# Patient Record
Sex: Male | Born: 1945 | Race: White | Hispanic: No | Marital: Married | Smoking: Former smoker
Health system: Southern US, Community
[De-identification: ages and names within clinical notes are randomized; demographics above are authoritative.]

## PROBLEM LIST (undated history)

## (undated) DIAGNOSIS — E785 Hyperlipidemia, unspecified: Secondary | ICD-10-CM

## (undated) DIAGNOSIS — H269 Unspecified cataract: Secondary | ICD-10-CM

## (undated) DIAGNOSIS — G43909 Migraine, unspecified, not intractable, without status migrainosus: Secondary | ICD-10-CM

## (undated) DIAGNOSIS — N200 Calculus of kidney: Secondary | ICD-10-CM

## (undated) DIAGNOSIS — N419 Inflammatory disease of prostate, unspecified: Secondary | ICD-10-CM

## (undated) HISTORY — DX: Hyperlipidemia, unspecified: E78.5

## (undated) HISTORY — DX: Migraine, unspecified, not intractable, without status migrainosus: G43.909

## (undated) HISTORY — PX: POLYPECTOMY: SHX149

## (undated) HISTORY — PX: PROSTATE BIOPSY: SHX241

## (undated) HISTORY — DX: Inflammatory disease of prostate, unspecified: N41.9

## (undated) HISTORY — PX: TONSILLECTOMY: SUR1361

## (undated) HISTORY — DX: Calculus of kidney: N20.0

## (undated) HISTORY — PX: OTHER SURGICAL HISTORY: SHX169

## (undated) HISTORY — DX: Unspecified cataract: H26.9

---

## 2011-10-09 HISTORY — PX: COLONOSCOPY: SHX174

## 2012-03-24 LAB — LIPID PANEL
LDL Cholesterol: 165 mg/dL
Triglycerides: 154 mg/dL (ref 40–160)

## 2012-03-24 LAB — CBC AND DIFFERENTIAL
Hemoglobin: 14.2 g/dL (ref 13.5–17.5)
WBC: 6.4 10^3/mL

## 2012-03-24 LAB — HEMOGLOBIN A1C: Hgb A1c MFr Bld: 6.1 % — AB (ref 4.0–6.0)

## 2012-03-24 LAB — HEPATIC FUNCTION PANEL
ALT: 17 U/L (ref 10–40)
AST: 28 U/L (ref 14–40)

## 2012-03-24 LAB — BASIC METABOLIC PANEL
Creatinine: 1.1 mg/dL (ref 0.6–1.3)
Glucose: 103 mg/dL
Potassium: 4.1 mmol/L (ref 3.4–5.3)

## 2012-03-24 LAB — TSH: TSH: 1.78 u[IU]/mL (ref 0.41–5.90)

## 2013-07-28 ENCOUNTER — Ambulatory Visit (INDEPENDENT_AMBULATORY_CARE_PROVIDER_SITE_OTHER): Payer: BC Managed Care – PPO | Admitting: Family Medicine

## 2013-07-28 ENCOUNTER — Encounter: Payer: Self-pay | Admitting: Family Medicine

## 2013-07-28 VITALS — BP 154/93 | HR 64 | Ht 76.0 in | Wt 266.0 lb

## 2013-07-28 DIAGNOSIS — L738 Other specified follicular disorders: Secondary | ICD-10-CM

## 2013-07-28 DIAGNOSIS — I10 Essential (primary) hypertension: Secondary | ICD-10-CM

## 2013-07-28 DIAGNOSIS — Z13220 Encounter for screening for lipoid disorders: Secondary | ICD-10-CM

## 2013-07-28 DIAGNOSIS — N411 Chronic prostatitis: Secondary | ICD-10-CM | POA: Insufficient documentation

## 2013-07-28 DIAGNOSIS — N200 Calculus of kidney: Secondary | ICD-10-CM

## 2013-07-28 DIAGNOSIS — L739 Follicular disorder, unspecified: Secondary | ICD-10-CM | POA: Insufficient documentation

## 2013-07-28 DIAGNOSIS — Z860100 Personal history of colon polyps, unspecified: Secondary | ICD-10-CM | POA: Insufficient documentation

## 2013-07-28 DIAGNOSIS — E785 Hyperlipidemia, unspecified: Secondary | ICD-10-CM

## 2013-07-28 DIAGNOSIS — R7309 Other abnormal glucose: Secondary | ICD-10-CM

## 2013-07-28 DIAGNOSIS — R7303 Prediabetes: Secondary | ICD-10-CM

## 2013-07-28 LAB — BASIC METABOLIC PANEL WITH GFR
BUN: 15 mg/dL (ref 6–23)
Chloride: 100 mEq/L (ref 96–112)
GFR, Est African American: >89 mL/min
GFR, Est Non African American: 86 mL/min
Glucose, Bld: 99 mg/dL (ref 70–99)
Potassium: 4.4 mEq/L (ref 3.5–5.3)
Sodium: 139 mEq/L (ref 135–145)

## 2013-07-28 LAB — LIPID PANEL
Cholesterol: 215 mg/dL — ABNORMAL HIGH (ref 0–200)
VLDL: 51 mg/dL — ABNORMAL HIGH (ref 0–40)

## 2013-07-28 MED ORDER — NYSTATIN-TRIAMCINOLONE 100000-0.1 UNIT/GM-% EX CREA: TOPICAL_CREAM | Freq: Three times a day (TID) | CUTANEOUS | Status: DC

## 2013-07-28 NOTE — Progress Notes (Signed)
CC: Anthony Santana is a 67 y.o. male is here for Establish Care   Subjective: HPI:  67 year old here to establish care who brings in medical records for review  Patient reports a history of essential hypertension reluctant to take antihypertensive medication. He tries to walk on a daily basis institute a low-salt diet. Most recent creatinine 1.07 2013. TSH 1.70 2013. In April 2013 he had a myocardial perfusion scan showing normal left ventricular size, no perfusion defect, normal ejection fraction estimated 55%.  Reports a history of abnormally elevated PSAs. He has had a total of 3 different biopsy procedures all of which have been negative for any oncologic process. His most recent biopsy caused him to become septic 2 days after biopsy he reports spending almost a week in the ICU unit in [place]. He denies any recent or remote history of dysuria, blood in urine, urinary hesitancy, awakening at night to urinate, weak stream, nor sensation of urinary retention. He has been trying to take a natural approach to treatment with saw palmetto  Labs and patient reported a history of hyperlipidemia he reports a statin intolerance. Most recent labs total cholesterol 236, triglycerides 154, HDL, 40, LDL 165. He's been using omega-3 supplements along with niacin however he is unsure what his niacin doses.  On review of his recent blood work hemoglobin A1c 6.08 March 2012 he is unaware of any remote diagnosis of prediabetes or diabetes.  He believes that he has had 2 or 3 colonoscopies the first were without any abnormality. He had one performed on September 2013 showing 3 polyps initial recommendation was to followup in 1 year pathology report showing tubular adenoma x2 without dysplasia or malignancy, rectal polyp with fragments of only hyperplastic polyp. Denies any recent or remote rectal bleeding.  Buttock Pain patient complains of buttock pain on the left buttock that has come and gone for the past  years. Described as a soreness worse with sitting nonradiating improves with topical steroid and nystatin cream. He has run out of this medication. He reports that he has had multiple boils lanced in the buttock region in the remote past. Currently denies fevers, chills, nor drainage at the site of pain.  Review of Systems - General ROS: negative for - chills, fever, night sweats, weight gain or weight loss Ophthalmic ROS: negative for - decreased vision Psychological ROS: negative for - anxiety or depression ENT ROS: negative for - hearing change, nasal congestion, tinnitus or allergies Hematological and Lymphatic ROS: negative for - bleeding problems, bruising or swollen lymph nodes Breast ROS: negative Respiratory ROS: no cough, shortness of breath, or wheezing Cardiovascular ROS: no chest pain or dyspnea on exertion Gastrointestinal ROS: no abdominal pain, change in bowel habits, or black or bloody stools Genito-Urinary ROS: negative for - genital discharge, genital ulcers, incontinence or abnormal bleeding from genitals Musculoskeletal ROS: negative for - joint pain or muscle pain Neurological ROS: negative for - headaches or memory loss Dermatological ROS: negative for mole changes, rash and skin lesion changes other than that described on buttocks  Past Medical History  Diagnosis Date  . Hyperlipidemia      Family History  Problem Relation Age of Onset  . Alcohol abuse    . Hyperlipidemia    . Hypertension    . Cancer       History  Substance Use Topics  . Smoking status: Former Smoker    Quit date: 07/28/1981  . Smokeless tobacco: Not on file  . Alcohol Use: No  Objective: Filed Vitals:   07/28/13 1057  BP: 154/93  Pulse: 64    General: Alert and Oriented, No Acute Distress HEENT: Pupils equal, round, reactive to light. Conjunctivae clear.  External ears unremarkable, canals clear with intact TMs with appropriate landmarks.  Middle ear appears open without  effusion. Pink inferior turbinates.  Moist mucous membranes, pharynx without inflammation nor lesions.  Neck supple without palpable lymphadenopathy nor abnormal masses. Lungs: Clear to auscultation bilaterally, no wheezing/ronchi/rales.  Comfortable work of breathing. Good air movement. Cardiac: Regular rate and rhythm. Normal S1/S2.  No murmurs, rubs, nor gallops.   Extremities: No peripheral edema.  Strong peripheral pulses.  Mental Status: No depression, anxiety, nor agitation. Skin: Warm and dry. On the left buttock there is a 4 cm diameter patch of folliculitis  Assessment & Plan: Reedy was seen today for establish care.  Diagnoses and associated orders for this visit:  Essential hypertension, benign - BASIC METABOLIC PANEL WITH GFR  Chronic prostatitis - PSA  Hyperlipidemia  Prediabetes  Folliculitis - Discontinue: nystatin-triamcinolone (MYCOLOG II) cream; Apply topically 3 (three) times daily. - nystatin-triamcinolone (MYCOLOG II) cream; Apply topically 3 (three) times daily.  Lipid screening - Lipid panel  Nephrolithiasis  Personal history of colonic polyps    Essential hypertension: Uncontrolled, Encouraged patient to start antihypertensive therapy declined by patient Chronic prostatitis: Checking PSA, joint decision to refer to urology only if significantly elevating Hyperlipidemia: Uncontrolled, patient does not want to start statin he will consider restarting niacin and continuing fish oil if liver panel today is again uncontrolled Prediabetes: Checking fasting blood sugar today Folliculitis: Discussed that he may need to start antibiotic her he would only like to continue with triamcinolone nystatin which has worked in the past. Signs and symptoms requring emergent/urgent reevaluation were discussed with the patient. Personal history of colonic polyps:  Brought to his attention that he is due for repeat colonoscopy based on report from September 2013, he wants to  wait until September 2015  45 minutes spent face-to-face during visit today of which at least 50% was counseling or coordinating care regarding nephrolithiasis, chronic prostatitis, essential hypertension, hyperlipidemia, prediabetes, folliculitis, colonic polyps.   Return in about 3 months (around 10/28/2013).

## 2013-07-29 ENCOUNTER — Encounter: Payer: Self-pay | Admitting: Family Medicine

## 2013-07-29 LAB — PSA: PSA: 5.69 ng/mL — ABNORMAL HIGH (ref <=4.00)

## 2013-08-05 ENCOUNTER — Encounter: Payer: Self-pay | Admitting: Family Medicine

## 2013-08-14 ENCOUNTER — Encounter: Payer: Self-pay | Admitting: *Deleted

## 2013-10-28 ENCOUNTER — Encounter: Payer: Self-pay | Admitting: Family Medicine

## 2013-10-28 ENCOUNTER — Telehealth: Payer: Self-pay | Admitting: Family Medicine

## 2013-10-28 ENCOUNTER — Ambulatory Visit (INDEPENDENT_AMBULATORY_CARE_PROVIDER_SITE_OTHER): Payer: BC Managed Care – PPO | Admitting: Family Medicine

## 2013-10-28 VITALS — BP 165/93 | HR 67 | Wt 269.0 lb

## 2013-10-28 DIAGNOSIS — L259 Unspecified contact dermatitis, unspecified cause: Secondary | ICD-10-CM

## 2013-10-28 DIAGNOSIS — R972 Elevated prostate specific antigen [PSA]: Secondary | ICD-10-CM | POA: Insufficient documentation

## 2013-10-28 DIAGNOSIS — L739 Follicular disorder, unspecified: Secondary | ICD-10-CM

## 2013-10-28 DIAGNOSIS — L738 Other specified follicular disorders: Secondary | ICD-10-CM

## 2013-10-28 DIAGNOSIS — F4321 Adjustment disorder with depressed mood: Secondary | ICD-10-CM

## 2013-10-28 DIAGNOSIS — E785 Hyperlipidemia, unspecified: Secondary | ICD-10-CM

## 2013-10-28 DIAGNOSIS — I10 Essential (primary) hypertension: Secondary | ICD-10-CM

## 2013-10-28 DIAGNOSIS — Z86010 Personal history of colonic polyps: Secondary | ICD-10-CM

## 2013-10-28 DIAGNOSIS — L309 Dermatitis, unspecified: Secondary | ICD-10-CM

## 2013-10-28 MED ORDER — TRIAMCINOLONE ACETONIDE 0.1 % EX CREA: TOPICAL_CREAM | CUTANEOUS | Status: DC

## 2013-10-28 MED ORDER — NYSTATIN 100000 UNIT/GM EX CREA: 1.0000 "application " | TOPICAL_CREAM | Freq: Two times a day (BID) | CUTANEOUS | Status: DC | PRN

## 2013-10-28 NOTE — Telephone Encounter (Signed)
Patient was here today asking me for his medical records. He wanted to take them since he got a call to pick them up but I do not know what to tell the patient. I did not see them up front. Pt's (p) [phone]. Thanks

## 2013-10-28 NOTE — Progress Notes (Signed)
CC: Anthony Santana is a 68 y.o. male is here for Hyperlipidemia   Subjective: HPI:  Essential hypertension: No outside blood pressures to report. He denies chest pain, shortness of breath, orthopnea nor peripheral edema. He is still reluctant to take any medications for blood pressure however has joined a gym is going there 3 days a week spending more than an hour they are doing strength training and cardio.  Followup hyperlipidemia: Continues to take niacin and fish oil. He did not start red yeast rice and so saw him last. Denies right upper quadrant pain, limb claudication, nor exertional chest pain tries to watch what he eats and working out more  History colonic polyps: Based on his most recent colonoscopy in 2013 his former gastroenterologist encouraged him to have a repeat colonoscopy in 2014. He denies any melena or rectal bleeding. He would like to go through with colonoscopy referral. Denies unintentional weight loss or new aches or pains  History of elevated PSA this has always been somewhat covered above or below 10 for the past at least 3 years. He's had multiple biopsies one of which required ICU stay for sepsis. He denies any lower urinary tract symptoms but is requesting referral to urologist  Folliculitis/dermatitis: He has been using a self mixture of triamcinolone and nystatin for this condition on his buttocks. He states that symptoms are absent now after using 2 weeks of the medication. He occasionally gets flares during humid months or after driving for long periods of time. He denies any rashes or skin changes currently  He shares the sad news that his mother was recently transferred to a skilled nursing facility and no longer has any short-term memory whatsoever. This is taking a emotional toll on  Him and states that prior to this news he was feeling feeling amazing prior to the news he got this morning both physically and emotionally . He is tearful and doesn't know how to  handle the news.   Review Of Systems Outlined In HPI  Past Medical History  Diagnosis Date  . Hyperlipidemia      Family History  Problem Relation Age of Onset  . Alcohol abuse    . Hyperlipidemia    . Hypertension    . Cancer       History  Substance Use Topics  . Smoking status: Former Smoker    Quit date: 07/28/1981  . Smokeless tobacco: Not on file  . Alcohol Use: No     Objective: Filed Vitals:   10/28/13 1051  BP: 165/93  Pulse: 67    General: Alert and Oriented, No Acute Distress HEENT: Pupils equal, round, reactive to light. Conjunctivae clear.  Moist mucous membranes pharynx unremarkable Lungs: Clear to auscultation bilaterally, no wheezing/ronchi/rales.  Comfortable work of breathing. Good air movement. Cardiac: Regular rate and rhythm. Normal S1/S2.  No murmurs, rubs, nor gallops.   Abdomen: Obese and soft Extremities: No peripheral edema.  Strong peripheral pulses.  Mental Status: Mild depression and tearfulness however and no, anxiety, nor agitation. Skin: Warm and dry.  Assessment & Plan: Anthony Santana was seen today for hyperlipidemia.  Diagnoses and associated orders for this visit:  Essential hypertension, benign  Hyperlipidemia  Personal history of colonic polyps - Ambulatory referral to Gastroenterology  Elevated PSA - Ambulatory referral to Urology  Folliculitis  Dermatitis - nystatin cream (MYCOSTATIN); Apply 1 application topically 2 (two) times daily as needed for dry skin (irritation). - triamcinolone cream (KENALOG) 0.1 %; Apply to affected areas twice a  day for up to two weeks, avoid face.  Grief    Essential hypertension: Encouraged to start on antihypertensives he politely declines, urged to continue with exercise most days of the week and salt restriction Hyperlipidemia: Uncontrolled encouraged him to continue on fish oil and start red yeast rice History colonic polyps: He is agreeable to colonoscopy referral this is been  placed Elevated PSA: Per his request urology referral Dermatitis/folliculitis: Resolved continue as needed use of nystatin/triamcinolone discussed signs and symptoms of boil/abscess that would require office visit for lancing Grief: Discussed that there are a few medications that help preserve memory however no true medications to reverse short-term memory loss or dementia, discussed that he inquire into certain reversible causes that could be causing difficulty with his mother.  Brief counseling provided for this heavy news  Return in about 3 months (around 01/26/2014).

## 2013-10-28 NOTE — Telephone Encounter (Signed)
The records that the patient is referring to had been left up at the front about 3 months ago.Unfortunatley the records have be shredded. After speaking with Anthony Santana our clinical supervisor our protocol will be that if records are not picked up after 90 days then they will be discarded. This is the message that I left on this patients vm

## 2013-11-05 ENCOUNTER — Telehealth: Payer: Self-pay | Admitting: *Deleted

## 2013-11-05 NOTE — Telephone Encounter (Signed)
Pt called and states he has not heard anything about his referral to urology and GI. The referral along with office notes nad demo have been sent to both offices. i called and left a message on pt's vm that referrals have been sent and waiting on their office to contact him and he could call to check on the status.  I left the number of [facility] on his vm

## 2013-11-11 ENCOUNTER — Encounter: Payer: Self-pay | Admitting: Internal Medicine

## 2013-12-28 ENCOUNTER — Telehealth: Payer: Self-pay | Admitting: *Deleted

## 2013-12-28 ENCOUNTER — Ambulatory Visit (AMBULATORY_SURGERY_CENTER): Payer: Self-pay | Admitting: *Deleted

## 2013-12-28 VITALS — Ht 76.0 in | Wt 272.6 lb

## 2013-12-28 DIAGNOSIS — Z86010 Personal history of colonic polyps: Secondary | ICD-10-CM

## 2013-12-28 MED ORDER — MOVIPREP 100 G PO SOLR: ORAL | Status: DC

## 2013-12-28 NOTE — Telephone Encounter (Signed)
Patient is for direct colonoscopy on 01/11/14. His last colonoscopy was 06/13/2012 in [place], polyps removed. Colon report and path report is in EPIC. Patient also had previous colonoscopies in 2004 and 2009 which were "normal,no polyps" per patient, no records. Patient was verbally told by his GI doctor in [place] to repeat colonoscopy in 2 years. Patient denies any GI problems/concerns, and he denies any family HX colon cancer.  Please review. Recall colonoscopy due at this time? Please advise, thank you. Anthony Santana

## 2013-12-28 NOTE — Progress Notes (Signed)
Patient denies any allergies to eggs or soy. Patient denies any problems with anesthesia. Phone note sent to Dr.Pyrtle to review records and to see if recall colon due now. EF 55% per records patient has in hands. Patient denies ANY heart problems or lung problems.

## 2013-12-29 NOTE — Telephone Encounter (Signed)
The previous GI MD performing the last colonoscopy, recommended in his procedure note, to repeat the exam in 1 year.  The reasoning behind this is unclear. Normally surveillance interval, 2 adenomatous polyps, the largest being 11 mm, without HGD would be repeat colonoscopy in 3 years.  This would be 06/14/2015 Thus, it appears, unless there is another reason not ascertainable by reviewing the procedure note and path from 2013, he would be due repeat in Sept 2016 If he would like to discuss this further, he can see me in clinic. Thanks

## 2013-12-29 NOTE — Telephone Encounter (Signed)
Noted- tried to call pt and not able to leave message

## 2013-12-30 NOTE — Telephone Encounter (Signed)
Anthony Santana talked with pt.  Colonoscopy cancelled and recall for 06/2015 entered into EPIC

## 2013-12-30 NOTE — Telephone Encounter (Signed)
Attempted to call pt:  His phone will not receive phone calls from blocked phone.  Barbie Haggis will call pt

## 2014-01-11 ENCOUNTER — Encounter: Payer: BC Managed Care – PPO | Admitting: Internal Medicine

## 2014-01-26 ENCOUNTER — Encounter: Payer: Self-pay | Admitting: Family Medicine

## 2014-01-26 ENCOUNTER — Ambulatory Visit (INDEPENDENT_AMBULATORY_CARE_PROVIDER_SITE_OTHER): Payer: BC Managed Care – PPO | Admitting: Family Medicine

## 2014-01-26 VITALS — BP 155/89 | HR 66 | Temp 97.5°F | Wt 274.0 lb

## 2014-01-26 DIAGNOSIS — J329 Chronic sinusitis, unspecified: Secondary | ICD-10-CM

## 2014-01-26 DIAGNOSIS — E785 Hyperlipidemia, unspecified: Secondary | ICD-10-CM

## 2014-01-26 DIAGNOSIS — B9689 Other specified bacterial agents as the cause of diseases classified elsewhere: Secondary | ICD-10-CM

## 2014-01-26 DIAGNOSIS — L0390 Cellulitis, unspecified: Secondary | ICD-10-CM

## 2014-01-26 DIAGNOSIS — N529 Male erectile dysfunction, unspecified: Secondary | ICD-10-CM

## 2014-01-26 DIAGNOSIS — L0291 Cutaneous abscess, unspecified: Secondary | ICD-10-CM

## 2014-01-26 DIAGNOSIS — A499 Bacterial infection, unspecified: Secondary | ICD-10-CM

## 2014-01-26 LAB — LIPID PANEL
CHOLESTEROL: 193 mg/dL (ref 0–200)
HDL: 36 mg/dL — ABNORMAL LOW (ref >39)
LDL Cholesterol: 117 mg/dL — ABNORMAL HIGH (ref 0–99)
Total CHOL/HDL Ratio: 5.4 Ratio
Triglycerides: 202 mg/dL — ABNORMAL HIGH (ref <150)
VLDL: 40 mg/dL (ref 0–40)

## 2014-01-26 MED ORDER — DOXYCYCLINE HYCLATE 100 MG PO TABS: 100.0000 mg | ORAL_TABLET | Freq: Two times a day (BID) | ORAL | Status: DC

## 2014-01-26 MED ORDER — SILDENAFIL CITRATE 50 MG PO TABS: 25.0000 mg | ORAL_TABLET | Freq: Every day | ORAL | Status: DC | PRN

## 2014-01-26 NOTE — Progress Notes (Signed)
CC: Anthony Santana is a 68 y.o. male is here for f/u chol and Hypertension   Subjective: HPI:  Followup hyperlipidemia: Since I saw patient last he has been taking 3 tablets of niacin on a daily basis, it sounds like this is a total of 1500 mg. Has also been taking red yeast rice of the unknown dose. Has been working out 3 days a week in cutting back on saturated fats. Denies chest pain, and claudication, myalgias nor right upper quadrant pain. Denies flushing  Patient complains of sinus pressure that has been localized above and below the eyes present for about a week worsening on a daily basis. Mild during the day, moderate at night, worse when lying down flat. Has been interfering with sleep due to drainage and nasal congestion. Nothing particularly makes better or worse other than above. He has been taking an Alka-Seltzer product with decongestant. Denies fevers, chills, shortness of breath, productive cough, chest pain, nor motor or sensory disturbances  Patient complains of clear drainage from his left buttock that has been present for the past week. He is a chronic draining pore that completely resolved as of one month ago using niacin and triamcinolone cream on a regimen that has been helpful for this in the past. Without any particular event he began draining earlier this week. He has not started any interventions yet.  Describes discharge as mild, clear, without blood or discoloration. Denies blood or rectal pain.  Complains of difficulty initiating erection has been present for matter of months, moderate severity, occurs any day of the week anytime of the day when he and his wife tried to have sex. Denies any dysuria, nor any other genitourinary complaints.  He works out 70 minutes at a time doing cardio above moderate intensity without any recent or remote chest discomfort, limb claudication, diaphoresis, nor motor or sensory disturbances   Review Of Systems Outlined In HPI  Past  Medical History  Diagnosis Date  . Hyperlipidemia   . Prostatitis   . Cataract   . Kidney stones     Past Surgical History  Procedure Laterality Date  . Polyps removed from colon    . Prostate biopsy      x3; 1st time went into septic shock  . Colonoscopy    . Tonsillectomy     Family History  Problem Relation Age of Onset  . Testicular cancer Father   . Alcohol abuse Father   . Cancer Maternal Grandmother   . Diabetes Maternal Grandmother   . Colon cancer Neg Hx     History   Social History  . Marital Status: Married    Spouse Name: N/A    Number of Children: N/A  . Years of Education: N/A   Occupational History  . Not on file.   Social History Main Topics  . Smoking status: Former Smoker    Quit date: 07/28/1981  . Smokeless tobacco: Never Used  . Alcohol Use: No  . Drug Use: No  . Sexual Activity: Yes   Other Topics Concern  . Not on file   Social History Narrative  . No narrative on file     Objective: BP 155/89  Pulse 66  Temp(Src) 97.5 F (36.4 C) (Oral)  Wt 274 lb (124.286 kg)  General: Alert and Oriented, No Acute Distress HEENT: Pupils equal, round, reactive to light. Conjunctivae clear.  External ears unremarkable, canals clear with intact TMs with appropriate landmarks.  Middle ear appears open without effusion. Pink inferior  turbinates.  Moist mucous membranes, pharynx without inflammation nor lesions however moderate cobblestoning.  Neck supple without palpable lymphadenopathy nor abnormal masses. Lungs: Clear to auscultation bilaterally, no wheezing/ronchi/rales.  Comfortable work of breathing. Good air movement. Cardiac: Regular rate and rhythm. Normal S1/S2.  No murmurs, rubs, nor gallops.   Extremities: No peripheral edema.  Strong peripheral pulses.  Mental Status: No depression, anxiety, nor agitation. Skin: Warm and dry. 2 cm pore on the right buttock with clear serous discharge of mild severity with slight erythema surrounding the  opening and no induration.  Assessment & Plan: Anthony Santana was seen today for f/u chol and hypertension.  Diagnoses and associated orders for this visit:  Hyperlipidemia - Lipid panel  Cellulitis - doxycycline (VIBRA-TABS) 100 MG tablet; Take 1 tablet (100 mg total) by mouth 2 (two) times daily.  Bacterial sinusitis - doxycycline (VIBRA-TABS) 100 MG tablet; Take 1 tablet (100 mg total) by mouth 2 (two) times daily.  Erectile dysfunction - sildenafil (VIAGRA) 50 MG tablet; Take 0.5 tablets (25 mg total) by mouth daily as needed for erectile dysfunction.    Hyperlipidemia: Checking lipid panel today Cellulitis: Antibiotic for bacterial sinusitis should help this. Discussed warm compresses, and signs and symptoms that would be more suggestive of an abscess. She'll sinusitis: Start doxycycline to avoid decongestants given blood pressure issues Erectile dysfunction: Start Viagra samples call if this is effective and I be happy to provide him with a formal prescription   Return in about 3 months (around 04/27/2014).

## 2014-01-27 ENCOUNTER — Encounter: Payer: Self-pay | Admitting: Family Medicine

## 2014-08-12 ENCOUNTER — Telehealth: Payer: Self-pay | Admitting: *Deleted

## 2014-08-12 NOTE — Telephone Encounter (Signed)
Wife called and left a message that she needs for "patients records to be sent to his urology office" for an appointment he has in Nov. I left a message that that office will need to send over a records release asking for what they need

## 2014-09-17 ENCOUNTER — Telehealth: Payer: Self-pay | Admitting: *Deleted

## 2014-09-17 DIAGNOSIS — E785 Hyperlipidemia, unspecified: Secondary | ICD-10-CM

## 2014-09-17 NOTE — Telephone Encounter (Signed)
Labs for cholesterol 

## 2014-09-22 ENCOUNTER — Telehealth: Payer: Self-pay | Admitting: Family Medicine

## 2014-09-22 DIAGNOSIS — E781 Pure hyperglyceridemia: Secondary | ICD-10-CM

## 2014-09-22 DIAGNOSIS — E785 Hyperlipidemia, unspecified: Secondary | ICD-10-CM

## 2014-09-22 LAB — LIPID PANEL
CHOLESTEROL: 255 mg/dL — AB (ref 0–200)
HDL: 37 mg/dL (ref 35–70)
LDL Cholesterol: 162 mg/dL
Triglycerides: 279 mg/dL — AB (ref 40–160)

## 2014-09-22 MED ORDER — ICOSAPENT ETHYL 1 G PO CAPS: 2.0000 | ORAL_CAPSULE | Freq: Two times a day (BID) | ORAL | Status: DC

## 2014-09-22 NOTE — Telephone Encounter (Signed)
Anthony Santana, Will you please let patient know that his LDL cholesterol was elevated at 162 with a goal of less than 100.  His triglycerides were elevatd at 279 with a goal of less than 150.  Since he is intolerant of statins I would recommend adding a pharmaceutical grade fish oil supplement to his medication regimen to help lower both cholesterol and triglycerides.  I've sent an Rx of Vascepa to his [facility] however he may want to stop by here to pick up a savings voucher in our sample closet before picking up the new Rx.  We'll want to recheck cholesterol in 3 months.

## 2014-09-22 NOTE — Telephone Encounter (Signed)
Message left on vm 

## 2014-09-24 ENCOUNTER — Telehealth: Payer: Self-pay | Admitting: *Deleted

## 2014-09-24 NOTE — Telephone Encounter (Signed)
Pt's spouse called and wanted to know if the Vacepa (sp) the rx fish oil is ok to take with other medication specifically coq10, multivitamins,red yeast rice. Left a message on pt's vm this for him to take

## 2014-09-28 ENCOUNTER — Telehealth: Payer: Self-pay | Admitting: *Deleted

## 2014-09-28 NOTE — Telephone Encounter (Signed)
Anthony Santana's wife called and is concerned about symptoms he is experiencing since starting this new medicatio, Vascepa,  and would like you to please call her. She considered taking him to the UC last evening. She said that he is unsteady on his feet and felt as if he could have a panic attack. She said that he is experiencing body aches, chills, sweats and fever of 102.3. She is very adamant that you call her. Please call on cell [phone].

## 2014-09-28 NOTE — Telephone Encounter (Signed)
Contacted wife, doubtful that medication is causing symptoms other than possible arthralgias.  Sounds more like the flu or bacterial infection. Advised to go to urgent care today for further eval since my schedule is already booked today.    Roselyn Reef, in the future for situations like this please ask the patients to schedule appointments or go to urgent care.

## 2014-10-26 ENCOUNTER — Telehealth: Payer: Self-pay | Admitting: *Deleted

## 2014-10-26 DIAGNOSIS — E785 Hyperlipidemia, unspecified: Secondary | ICD-10-CM

## 2014-10-26 NOTE — Telephone Encounter (Signed)
Pt states his copay on Vascepa has doubled and he can no longer afford the medication. He would like something else sent in for his cholesterol.Per 09/22/14 phone pt will be due for labs around march

## 2014-10-27 MED ORDER — OMEGA-3-ACID ETHYL ESTERS 1 G PO CAPS: 2.0000 g | ORAL_CAPSULE | Freq: Two times a day (BID) | ORAL | Status: DC

## 2014-10-27 NOTE — Telephone Encounter (Signed)
closed

## 2014-10-27 NOTE — Telephone Encounter (Signed)
Lovaza sent to [facility] as a substitute.  Similar to Vascepa this is a purified formulation of omega acids.

## 2014-10-27 NOTE — Telephone Encounter (Signed)
Left message on vm

## 2014-12-04 ENCOUNTER — Other Ambulatory Visit: Payer: Self-pay | Admitting: Family Medicine

## 2014-12-17 ENCOUNTER — Encounter: Payer: Self-pay | Admitting: Family Medicine

## 2014-12-27 ENCOUNTER — Ambulatory Visit: Payer: BC Managed Care – PPO | Admitting: Family Medicine

## 2014-12-27 ENCOUNTER — Other Ambulatory Visit: Payer: Self-pay | Admitting: *Deleted

## 2014-12-27 DIAGNOSIS — E785 Hyperlipidemia, unspecified: Secondary | ICD-10-CM

## 2014-12-27 LAB — LIPID PANEL
Cholesterol: 244 mg/dL — AB (ref 0–200)
HDL: 35 mg/dL (ref 35–70)
LDL Cholesterol: 165 mg/dL
Triglycerides: 221 mg/dL — AB (ref 40–160)

## 2014-12-27 LAB — BASIC METABOLIC PANEL
Creatinine: 1.1 mg/dL (ref 0.6–1.3)
GLUCOSE: 100 mg/dL
Potassium: 5 mmol/L (ref 3.4–5.3)
SODIUM: 142 mmol/L (ref 137–147)

## 2014-12-27 LAB — HEPATIC FUNCTION PANEL
ALK PHOS: 66 U/L (ref 25–125)
ALT: 11 U/L (ref 10–40)
AST: 26 U/L (ref 14–40)

## 2014-12-30 ENCOUNTER — Telehealth: Payer: Self-pay | Admitting: Family Medicine

## 2014-12-30 DIAGNOSIS — E785 Hyperlipidemia, unspecified: Secondary | ICD-10-CM

## 2014-12-30 NOTE — Telephone Encounter (Signed)
Anthony Santana, Will you please let patient know that his kidney function and liver function appears to be stable.  Blood sugar was just barely in the pre-diabetic range, by one point, far from needed blood sugar lowering medication.  This can be improved with engaging in 30-45 minutes of moderate exercise most days of the week and reducing carbohydrate intake.  Cholesterol is overall unchanged and uncontrolled from December, since his fish oil formulation changed in late January my only recommendation would be to have this rechecked in late April.  I'd also recommend f/u to check on blood pressure.

## 2015-01-04 NOTE — Telephone Encounter (Signed)
Pt.notified

## 2015-01-06 ENCOUNTER — Telehealth: Payer: Self-pay | Admitting: *Deleted

## 2015-01-07 MED ORDER — NIACIN ER 500 MG PO CPCR: 500.0000 mg | ORAL_CAPSULE | Freq: Every day | ORAL | Status: DC

## 2015-01-07 NOTE — Telephone Encounter (Signed)
Yes, it should help with triglycerides and some with cholesterol.  There is a OTC formulation of this but I'll send a Rx to his [facility] just in case his insurance covers it.

## 2015-01-07 NOTE — Telephone Encounter (Signed)
Called & informed patient that Rx has been sent & OTC is available

## 2015-01-13 ENCOUNTER — Encounter: Payer: Self-pay | Admitting: Family Medicine

## 2015-02-02 ENCOUNTER — Encounter: Payer: Self-pay | Admitting: Family Medicine

## 2015-02-16 ENCOUNTER — Ambulatory Visit (INDEPENDENT_AMBULATORY_CARE_PROVIDER_SITE_OTHER): Payer: Federal, State, Local not specified - PPO | Admitting: Family Medicine

## 2015-02-16 ENCOUNTER — Encounter: Payer: Self-pay | Admitting: Family Medicine

## 2015-02-16 VITALS — BP 127/78 | HR 65 | Ht 76.0 in | Wt 270.0 lb

## 2015-02-16 DIAGNOSIS — Z0000 Encounter for general adult medical examination without abnormal findings: Secondary | ICD-10-CM

## 2015-02-16 DIAGNOSIS — R972 Elevated prostate specific antigen [PSA]: Secondary | ICD-10-CM

## 2015-02-16 NOTE — Progress Notes (Signed)
Colonoscopy:declined Prostate: Discussed screening risks/beneifts with patient today, he's had screening within the last year with his urologist and would like to follow up with him.  Influenza Vaccine: no current indication Pneumovax: encouraged to get this at [facility] as soon as possible Td/Tdap: up-to-date Zoster: up-to-date   Subjective:    Anthony Santana is a 69 y.o. male who presents for Medicare Annual/Subsequent preventive examination.   Preventive Screening-Counseling & Management  Tobacco History  Smoking status  . Former Smoker  . Quit date: 07/28/1981  Smokeless tobacco  . Never Used    Problems Prior to Visit 1. cellullitis -resolved, HLD  Current Problems (verified) Patient Active Problem List   Diagnosis Date Noted  . Elevated PSA 10/28/2013  . Essential hypertension, benign 07/28/2013  . Chronic prostatitis 07/28/2013  . Hyperlipidemia 07/28/2013  . Prediabetes 07/28/2013  . Nephrolithiasis 07/28/2013  . Personal history of colonic polyps 07/28/2013  . Folliculitis 64/40/3474    Medications Prior to Visit Current Outpatient Prescriptions on File Prior to Visit  Medication Sig Dispense Refill  . Coenzyme Q10 (COQ10 PO) Take by mouth.    . Multiple Vitamin (MULTIVITAMIN) tablet Take 1 tablet by mouth daily.    . niacin 500 MG CR capsule Take 1 capsule (500 mg total) by mouth at bedtime. 90 capsule 3  . omega-3 acid ethyl esters (LOVAZA) 1 G capsule Take 2 capsules (2 g total) by mouth 2 (two) times daily. 120 capsule 6  . Omega-3 Fatty Acids (OMEGA 3 PO) Take by mouth.    . Saw Palmetto, Serenoa repens, (SAW PALMETTO PO) Take by mouth.    . sildenafil (VIAGRA) 50 MG tablet Take 0.5 tablets (25 mg total) by mouth daily as needed for erectile dysfunction. 5 tablet 0   No current facility-administered medications on file prior to visit.    Current Medications (verified) Current Outpatient Prescriptions  Medication Sig Dispense Refill  . aspirin 81  MG tablet Take 81 mg by mouth daily.    . Coenzyme Q10 (COQ10 PO) Take by mouth.    . Multiple Vitamin (MULTIVITAMIN) tablet Take 1 tablet by mouth daily.    . niacin 500 MG CR capsule Take 1 capsule (500 mg total) by mouth at bedtime. 90 capsule 3  . omega-3 acid ethyl esters (LOVAZA) 1 G capsule Take 2 capsules (2 g total) by mouth 2 (two) times daily. 120 capsule 6  . Omega-3 Fatty Acids (OMEGA 3 PO) Take by mouth.    . Saw Palmetto, Serenoa repens, (SAW PALMETTO PO) Take by mouth.    . sildenafil (VIAGRA) 50 MG tablet Take 0.5 tablets (25 mg total) by mouth daily as needed for erectile dysfunction. 5 tablet 0   No current facility-administered medications for this visit.     Allergies (verified) Statins   PAST HISTORY  Family History Family History  Problem Relation Age of Onset  . Testicular cancer Father   . Alcohol abuse Father   . Cancer Maternal Grandmother   . Diabetes Maternal Grandmother   . Colon cancer Neg Hx     Social History History  Substance Use Topics  . Smoking status: Former Smoker    Quit date: 07/28/1981  . Smokeless tobacco: Never Used  . Alcohol Use: No    Are there smokers in your home (other than you)?  No  Risk Factors Current exercise habits: Gym/ health club routine includes treadmill.  Dietary issues discussed: DASH   Cardiac risk factors: advanced age (older than 92 for men, 78  for women), diabetes mellitus, dyslipidemia and hypertension.  Depression Screen (Note: if answer to either of the following is "Yes", a more complete depression screening is indicated)   Q1: Over the past two weeks, have you felt down, depressed or hopeless? No  Q2: Over the past two weeks, have you felt little interest or pleasure in doing things? No  Have you lost interest or pleasure in daily life? No  Do you often feel hopeless? No  Do you cry easily over simple problems? No  Activities of Daily Living In your present state of health, do you have any  difficulty performing the following activities?:  Driving? No Managing money?  No Feeding yourself? No Getting from bed to chair? No Climbing a flight of stairs? No Preparing food and eating?: No Bathing or showering? No Getting dressed: No Getting to the toilet? No Using the toilet:No Moving around from place to place: No In the past year have you fallen or had a near fall?:No   Are you sexually active?  Yes  Do you have more than one partner?  No  Hearing Difficulties: No Do you often ask people to speak up or repeat themselves? No Do you experience ringing or noises in your ears? No Do you have difficulty understanding soft or whispered voices? No   Do you feel that you have a problem with memory? No  Do you often misplace items? No  Do you feel safe at home?  Yes  Cognitive Testing  Alert? Yes  Normal Appearance?Yes  Oriented to person? Yes  Place? Yes   Time? Yes  Recall of three objects?  Yes  Can perform simple calculations? Yes  Displays appropriate judgment?Yes  Can read the correct time from a watch face?Yes   Advanced Directives have been discussed with the patient? Yes   List the Names of Other Physician/Practitioners you currently use: 1.  [facility]  Indicate any recent Medical Services you may have received from other than [facility] providers in the past year (date may be approximate).   There is no immunization history on file for this patient.  Screening Tests Health Maintenance  Topic Date Due  . TETANUS/TDAP  06/04/1966  . ZOSTAVAX  06/05/2007  . PNA vac Low Risk Adult (1 of 2 - PCV13) 06/04/2012  . INFLUENZA VACCINE  05/09/2015  . COLONOSCOPY  06/13/2022    All answers were reviewed with the patient and necessary referrals were made:  Marcial Pacas, DO   02/16/2015   History reviewed: allergies, current medications, past family history, past medical history, past social history, past surgical history and problem list  Review of  Systems Review of Systems - General ROS: negative for - chills, fever, night sweats, weight gain or weight loss Ophthalmic ROS: negative for - decreased vision Psychological ROS: negative for - anxiety or depression ENT ROS: negative for - hearing change, nasal congestion, tinnitus or allergies Hematological and Lymphatic ROS: negative for - bleeding problems, bruising or swollen lymph nodes Breast ROS: negative Respiratory ROS: no cough, shortness of breath, or wheezing Cardiovascular ROS: no chest pain or dyspnea on exertion Gastrointestinal ROS: no abdominal pain, change in bowel habits, or black or bloody stools Genito-Urinary ROS: negative for - genital discharge, genital ulcers, incontinence or abnormal bleeding from genitals Musculoskeletal ROS: negative for - joint pain or muscle pain Neurological ROS: negative for - headaches or memory loss Dermatological ROS: negative for lumps, mole changes, rash and skin lesion changes   Objective:  Vision by Snellen chart: right eye:20/25, left eye:20/25 Blood pressure 127/78, pulse 65, height 6\' 4"  (1.93 m), weight 270 lb (122.471 kg). Body mass index is 32.88 kg/(m^2).  General: No Acute Distress HEENT: Atraumatic, normocephalic, conjunctivae normal without scleral icterus.  No nasal discharge, hearing grossly intact, TMs with good landmarks bilaterally with no middle ear abnormalities, posterior pharynx clear without oral lesions. Neck: Supple, trachea midline, no cervical nor supraclavicular adenopathy. Pulmonary: Clear to auscultation bilaterally without wheezing, rhonchi, nor rales. Cardiac: Regular rate and rhythm.  No murmurs, rubs, nor gallops. No peripheral edema.  2+ peripheral pulses bilaterally. Abdomen: Bowel sounds normal.  No masses.  Non-tender without rebound.  Negative Murphy's sign. MSK: Grossly intact, no signs of weakness.  Full strength throughout upper and lower extremities.  Full ROM in upper and lower extremities.   No midline spinal tenderness. Neuro: Gait unremarkable, CN II-XII grossly intact.  C5-C6 Reflex 2/4 Bilaterally, L4 Reflex 2/4 Bilaterally.  Cerebellar function intact. Skin: No rashes. Psych: Alert and oriented to person/place/time.  Thought process normal. No anxiety/depression.     Assessment:     Due for a pneumonia vaccine 2, Prevnar first he prefers to have this done at [facility].     Plan:     During the course of the visit the patient was educated and counseled about appropriate screening and preventive services including:    Pneumococcal vaccine   Diabetes screening  Diet review for nutrition referral? No   Patient Instructions (the written plan) was given to the patient.  Medicare Attestation I have personally reviewed: The patient's medical and social history Their use of alcohol, tobacco or illicit drugs Their current medications and supplements The patient's functional ability including ADLs,fall risks, home safety risks, cognitive, and hearing and visual impairment Diet and physical activities Evidence for depression or mood disorders  The patient's weight, height, BMI, and visual acuity have been recorded in the chart.  I have made referrals, counseling, and provided education to the patient based on review of the above and I have provided the patient with a written personalized care plan for preventive services.     Marcial Pacas, DO   02/16/2015

## 2015-02-16 NOTE — Patient Instructions (Signed)
Dr. Lella Mullany's General Advice Following Your Complete Physical Exam  The Benefits of Regular Exercise: Unless you suffer from an uncontrolled cardiovascular condition, studies strongly suggest that regular exercise and physical activity will add to both the quality and length of your life.  The World Health Organization recommends 150 minutes of moderate intensity aerobic activity every week.  This is best split over 3-4 days a week, and can be as simple as a brisk walk for just over 35 minutes "most days of the week".  This type of exercise has been shown to lower LDL-Cholesterol, lower average blood sugars, lower blood pressure, lower cardiovascular disease risk, improve memory, and increase one's overall sense of wellbeing.  The addition of anaerobic (or "strength training") exercises offers additional benefits including but not limited to increased metabolism, prevention of osteoporosis, and improved overall cholesterol levels.  How Can I Strive For A Low-Fat Diet?: Current guidelines recommend that 25-35 percent of your daily energy (food) intake should come from fats.  One might ask how can this be achieved without having to dissect each meal on a daily basis?  Switch to skim or 1% milk instead of whole milk.  Focus on lean meats such as ground turkey, fresh fish, baked chicken, and lean cuts of beef as your source of dietary protein.  Limit saturated fat consumption to less than 10% of your daily caloric intake.  Limit trans fatty acid consumption primarily by limiting synthetic trans fats such as partially hydrogenated oils (Ex: fried fast foods).  Substitute olive or vegetable oil for solid fats where possible.  Moderation of Salt Intake: Provided you don't carry a diagnosis of congestive heart failure nor renal failure, I recommend a daily allowance of no more than 2300 mg of salt (sodium).  Keeping under this daily goal is associated with a decreased risk of cardiovascular events, creeping  above it can lead to elevated blood pressures and increases your risk of cardiovascular events.  Milligrams (mg) of salt is listed on all nutrition labels, and your daily intake can add up faster than you think.  Most canned and frozen dinners can pack in over half your daily salt allowance in one meal.    Lifestyle Health Risks: Certain lifestyle choices carry specific health risks.  As you may already know, tobacco use has been associated with increasing one's risk of cardiovascular disease, pulmonary disease, numerous cancers, among many other issues.  What you may not know is that there are medications and nicotine replacement strategies that can more than double your chances of successfully quitting.  I would be thrilled to help manage your quitting strategy if you currently use tobacco products.  When it comes to alcohol use, I've yet to find an "ideal" daily allowance.  Provided an individual does not have a medical condition that is exacerbated by alcohol consumption, general guidelines determine "safe drinking" as no more than two standard drinks for a man or no more than one standard drink for a male per day.  However, much debate still exists on whether any amount of alcohol consumption is technically "safe".  My general advice, keep alcohol consumption to a minimum for general health promotion.  If you or others believe that alcohol, tobacco, or recreational drug use is interfering with your life, I would be happy to provide confidential counseling regarding treatment options.  General "Over The Counter" Nutrition Advice: Postmenopausal women should aim for a daily calcium intake of 1200 mg, however a significant portion of this might already be   provided by diets including milk, yogurt, cheese, and other dairy products.  Vitamin D has been shown to help preserve bone density, prevent fatigue, and has even been shown to help reduce falls in the elderly.  Ensuring a daily intake of 800 Units of  Vitamin D is a good place to start to enjoy the above benefits, we can easily check your Vitamin D level to see if you'd potentially benefit from supplementation beyond 800 Units a day.  Folic Acid intake should be of particular concern to women of childbearing age.  Daily consumption of 400-800 mcg of Folic Acid is recommended to minimize the chance of spinal cord defects in a fetus should pregnancy occur.    For many adults, accidents still remain one of the most common culprits when it comes to cause of death.  Some of the simplest but most effective preventitive habits you can adopt include regular seatbelt use, proper helmet use, securing firearms, and regularly testing your smoke and carbon monoxide detectors.  Naquan Garman B. Jerod Mcquain DO [facility] [address], [address] [place], [place] [address] Phone: [phone]  

## 2015-06-05 ENCOUNTER — Encounter: Payer: Self-pay | Admitting: Internal Medicine

## 2015-06-22 ENCOUNTER — Other Ambulatory Visit: Payer: Self-pay | Admitting: Family Medicine

## 2015-07-26 ENCOUNTER — Encounter: Payer: Self-pay | Admitting: Internal Medicine

## 2015-08-03 ENCOUNTER — Encounter: Payer: Self-pay | Admitting: Family Medicine

## 2015-08-03 ENCOUNTER — Ambulatory Visit (INDEPENDENT_AMBULATORY_CARE_PROVIDER_SITE_OTHER): Payer: Federal, State, Local not specified - PPO | Admitting: Family Medicine

## 2015-08-03 VITALS — BP 150/90 | HR 65 | Wt 268.0 lb

## 2015-08-03 DIAGNOSIS — B356 Tinea cruris: Secondary | ICD-10-CM

## 2015-08-03 DIAGNOSIS — E785 Hyperlipidemia, unspecified: Secondary | ICD-10-CM | POA: Diagnosis not present

## 2015-08-03 DIAGNOSIS — I10 Essential (primary) hypertension: Secondary | ICD-10-CM

## 2015-08-03 LAB — LIPID PANEL
Cholesterol: 233 mg/dL — AB (ref 0–200)
HDL: 36 mg/dL (ref 35–70)
LDL CALC: 162 mg/dL
Triglycerides: 177 mg/dL — AB (ref 40–160)

## 2015-08-03 LAB — HEMOGLOBIN A1C: Hgb A1c MFr Bld: 5.7 % (ref 4.0–6.0)

## 2015-08-03 MED ORDER — NYSTATIN 100000 UNIT/GM EX CREA
1.0000 | TOPICAL_CREAM | Freq: Two times a day (BID) | CUTANEOUS | Status: AC
Start: 2015-08-03 — End: ?

## 2015-08-03 NOTE — Progress Notes (Signed)
CC: Anthony Santana is a 69 y.o. male is here for Follow-up   Subjective: HPI:  Follow essential hypertension: Currently not taking any medications to help with blood pressure. He goes to the gym most days of the week and works out for at least 30 minutes. He tells me his blood pressures normal and only elevated today due to being irritated further described below. Denies chest pain shortness of breath orthopnea nor peripheral edema.  Follow-up hyperlipidemia: He is due for repeat lipid panel. He is taking niacin, prescription strength omega-3's and trying to watch what he eats. Denies limb claudication or joint pain  Requesting refills on nystatin cream. He uses this frequently when he gets jock itch. If he uses it usually goes away for a few weeks. He is very irritated today that this medication was not automatically refilled after it reached a date that was one year after it was originally prescribed. Apparently he came in at some remote date and was told that he needed an appointment with me to have this refilled he was not in agreement with this and decided that he and his wife would no longer see me however they have had a change of heart and came today's appointment. He literally tells me the story 5 times during our encounter today.   Review Of Systems Outlined In HPI  Past Medical History  Diagnosis Date  . Hyperlipidemia   . Prostatitis   . Cataract   . Kidney stones     Past Surgical History  Procedure Laterality Date  . Polyps removed from colon    . Prostate biopsy      x3; 1st time went into septic shock  . Colonoscopy    . Tonsillectomy     Family History  Problem Relation Age of Onset  . Testicular cancer Father   . Alcohol abuse Father   . Cancer Maternal Grandmother   . Diabetes Maternal Grandmother   . Colon cancer Neg Hx     Social History   Social History  . Marital Status: Married    Spouse Name: N/A  . Number of Children: N/A  . Years of Education:  N/A   Occupational History  . Not on file.   Social History Main Topics  . Smoking status: Former Smoker    Quit date: 07/28/1981  . Smokeless tobacco: Never Used  . Alcohol Use: No  . Drug Use: No  . Sexual Activity: Yes   Other Topics Concern  . Not on file   Social History Narrative     Objective: BP 150/90 mmHg  Pulse 65  Wt 268 lb (121.564 kg)  General: Alert and Oriented, No Acute Distress HEENT: Pupils equal, round, reactive to light. Conjunctivae clear. Moist mucous membranes  Lungs: Clear to auscultation bilaterally, no wheezing/ronchi/rales.  Comfortable work of breathing. Good air movement. Cardiac: Regular rate and rhythm. Normal S1/S2.  No murmurs, rubs, nor gallops.   Extremities: No peripheral edema.  Strong peripheral pulses.  Mental Status: No depression, anxiety. Moderately irritated. Skin: Warm and dry.  Assessment & Plan: Anthony Santana was seen today for follow-up.  Diagnoses and all orders for this visit:  Essential hypertension, benign  Hyperlipidemia -     Lipid panel  Jock itch  Other orders -     nystatin cream (MYCOSTATIN); Apply 1 application topically 2 (two) times daily.   Essential hypertension: Uncontrolled chronic condition, he is not interested in pursuing medical therapy as he believes his blood pressure is elevated  today because he is upset. I cannot change his mind. Hyperlipidemia: Due for lipid panel continue niacin and omega-3's pending results Jock itch: Time was taken to answer all of his questions and address why this medication was not refilled automatically one year after it had been originally prescribed. Discussed for patient safety medication should be reviewed before medically prescribed at least every year. Discussed that this is the model of my practice and that all my colleagues here in the clinic. I'm happy to have him back but if this model does not fit his needs he'll need to seek out another healthcare model that fits his  needs.  40 minutes spent face-to-face during visit today of which at least 50% was counseling or coordinating care regarding: 1. Essential hypertension, benign   2. Hyperlipidemia   3. Jock itch      Return in about 3 months (around 11/03/2015).

## 2015-08-04 ENCOUNTER — Telehealth: Payer: Self-pay | Admitting: Family Medicine

## 2015-08-04 MED ORDER — OMEGA-3-ACID ETHYL ESTERS 1 G PO CAPS: 2.0000 g | ORAL_CAPSULE | Freq: Two times a day (BID) | ORAL | Status: DC

## 2015-08-04 NOTE — Telephone Encounter (Signed)
Will you please let patient know that his triglycerides have improved with the recent use of Lovaza fish oil capsules.  I will send in refills. His LDL cholesterol has not improved or worsened.  The triglyceride news is great, with his intolerance to statins I'm not sure there's much that can be done for hisLDL cholesterol since he's already eating healthy and staying very active.

## 2015-08-05 NOTE — Telephone Encounter (Signed)
Pt advised.

## 2015-08-22 ENCOUNTER — Encounter: Payer: Self-pay | Admitting: Emergency Medicine

## 2015-08-22 ENCOUNTER — Ambulatory Visit (AMBULATORY_SURGERY_CENTER): Payer: Self-pay | Admitting: *Deleted

## 2015-08-22 VITALS — Ht 76.0 in | Wt 268.4 lb

## 2015-08-22 DIAGNOSIS — Z86010 Personal history of colonic polyps: Secondary | ICD-10-CM

## 2015-08-22 NOTE — Progress Notes (Signed)
Patient denies any allergies to egg or soy products. Patient denies complications with anesthesia/sedation.  Patient denies oxygen use at home and denies diet medications. Emmi instructions for colonoscopy explained but patient refused.   

## 2015-09-12 ENCOUNTER — Encounter: Payer: Self-pay | Admitting: Internal Medicine

## 2015-09-12 ENCOUNTER — Ambulatory Visit (AMBULATORY_SURGERY_CENTER): Payer: Federal, State, Local not specified - PPO | Admitting: Internal Medicine

## 2015-09-12 VITALS — BP 123/72 | HR 72 | Temp 97.6°F | Resp 17 | Ht 76.0 in | Wt 268.0 lb

## 2015-09-12 DIAGNOSIS — D124 Benign neoplasm of descending colon: Secondary | ICD-10-CM

## 2015-09-12 DIAGNOSIS — D123 Benign neoplasm of transverse colon: Secondary | ICD-10-CM

## 2015-09-12 DIAGNOSIS — D125 Benign neoplasm of sigmoid colon: Secondary | ICD-10-CM

## 2015-09-12 DIAGNOSIS — Z86010 Personal history of colonic polyps: Secondary | ICD-10-CM | POA: Diagnosis present

## 2015-09-12 DIAGNOSIS — D120 Benign neoplasm of cecum: Secondary | ICD-10-CM

## 2015-09-12 MED ORDER — SODIUM CHLORIDE 0.9 % IV SOLN: 500.0000 mL | INTRAVENOUS | Status: DC

## 2015-09-12 NOTE — Op Note (Signed)
[place]  [address]. [place], [address]   COLONOSCOPY PROCEDURE REPORT  PATIENT: Nandan, Bollmann  MR#: [id number] BIRTHDATE: April 02, 1946 , 69  yrs. old GENDER: male ENDOSCOPIST: Jerene Bears, MD REFERRED BY: Marcial Pacas PROCEDURE DATE:  09/12/2015 PROCEDURE:   Colonoscopy, surveillance and Colonoscopy with snare polypectomy First Screening Colonoscopy - Avg.  risk and is 50 yrs.  old or older - No.  Prior Negative Screening - Now for repeat screening. N/A  History of Adenoma - Now for follow-up colonoscopy & has been > or = to 3 yrs.  Yes hx of adenoma.  Has been 3 or more years since last colonoscopy.  Polyps removed today? 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 MEDICATIONS: Monitored anesthesia care and Propofol 400 mg IV  DESCRIPTION OF PROCEDURE:   After the risks benefits and alternatives of the procedure were thoroughly explained, informed consent was obtained.  The digital rectal exam revealed no rectal mass.   The LB [id number]  endoscope was introduced through the anus and advanced to the cecum, which was identified by both the appendix and ileocecal valve. No adverse events experienced. The quality of the prep was good.  (Suprep was used)  The instrument was then slowly withdrawn as the colon was fully examined. Estimated blood loss is zero unless otherwise noted in this procedure report.      COLON FINDINGS: Six sessile polyps ranging between 3-63mm in size were found at the cecum (1), in the transverse colon (1), sigmoid colon (2), and descending colon (2).  Polypectomies were performed with a cold snare.  The resection was complete, the polyp tissue was completely retrieved and sent to histology.   A pedunculated polyp measuring 8 mm in size was found in the transverse colon.  A polypectomy was performed using snare cautery.  The resection  was complete, the polyp tissue was completely retrieved and sent to histology.  Retroflexed views revealed internal hemorrhoids. The time to cecum = 5.4 Withdrawal time = 18.8   The scope was withdrawn and the procedure completed.  COMPLICATIONS: There were no immediate complications.  ENDOSCOPIC IMPRESSION: 1.   Six sessile polyps ranging between 3-86mm in size were found at the cecum, in the transverse colon, sigmoid colon, and descending colon; polypectomies were performed with a cold snare 2.   Pedunculated polyp was found in the transverse colon; polypectomy was performed using snare cautery  RECOMMENDATIONS: 1.  Avoid all NSAIDs for the next 2 weeks. 2.  Await pathology results 3.  Repeat Colonoscopy in 3 years. 4.  You will receive a letter within 1-2 weeks with the results of your biopsy as well as final recommendations.  Please call my office if you have not received a letter after 3 weeks.  eSigned:  Jerene Bears, MD 09/12/2015 3:41 PM   cc:  the patient, Dr. Ileene Rubens   PATIENT NAME:  Khang, Holeman MR#: [id number]

## 2015-09-12 NOTE — Progress Notes (Signed)
Called to room to assist during endoscopic procedure.  Patient ID and intended procedure confirmed with present staff. Received instructions for my participation in the procedure from the performing physician.  

## 2015-09-12 NOTE — Patient Instructions (Signed)
Discharge instructions given. Handout on polyps. Resume previous medications. YOU HAD AN ENDOSCOPIC PROCEDURE TODAY AT THE [facility]:   Refer to the procedure report that was given to you for any specific questions about what was found during the examination.  If the procedure report does not answer your questions, please call your gastroenterologist to clarify.  If you requested that your care partner not be given the details of your procedure findings, then the procedure report has been included in a sealed envelope for you to review at your convenience later.  YOU SHOULD EXPECT: Some feelings of bloating in the abdomen. Passage of more gas than usual.  Walking can help get rid of the air that was put into your GI tract during the procedure and reduce the bloating. If you had a lower endoscopy (such as a colonoscopy or flexible sigmoidoscopy) you may notice spotting of blood in your stool or on the toilet paper. If you underwent a bowel prep for your procedure, you may not have a normal bowel movement for a few days.  Please Note:  You might notice some irritation and congestion in your nose or some drainage.  This is from the oxygen used during your procedure.  There is no need for concern and it should clear up in a day or so.  SYMPTOMS TO REPORT IMMEDIATELY:   Following lower endoscopy (colonoscopy or flexible sigmoidoscopy):  Excessive amounts of blood in the stool  Significant tenderness or worsening of abdominal pains  Swelling of the abdomen that is new, acute  Fever of 100F or higher   For urgent or emergent issues, a gastroenterologist can be reached at any hour by calling [phone].   DIET: Your first meal following the procedure should be a small meal and then it is ok to progress to your normal diet. Heavy or fried foods are harder to digest and may make you feel nauseous or bloated.  Likewise, meals heavy in dairy and vegetables can increase bloating.  Drink  plenty of fluids but you should avoid alcoholic beverages for 24 hours.  ACTIVITY:  You should plan to take it easy for the rest of today and you should NOT DRIVE or use heavy machinery until tomorrow (because of the sedation medicines used during the test).    FOLLOW UP: Our staff will call the number listed on your records the next business day following your procedure to check on you and address any questions or concerns that you may have regarding the information given to you following your procedure. If we do not reach you, we will leave a message.  However, if you are feeling well and you are not experiencing any problems, there is no need to return our call.  We will assume that you have returned to your regular daily activities without incident.  If any biopsies were taken you will be contacted by phone or by letter within the next 1-3 weeks.  Please call us at [phone] if you have not heard about the biopsies in 3 weeks.    SIGNATURES/CONFIDENTIALITY: You and/or your care partner have signed paperwork which will be entered into your electronic medical record.  These signatures attest to the fact that that the information above on your After Visit Summary has been reviewed and is understood.  Full responsibility of the confidentiality of this discharge information lies with you and/or your care-partner. 

## 2015-09-12 NOTE — Progress Notes (Signed)
To recovery, report to McCoy, RN, VSS 

## 2015-09-13 ENCOUNTER — Telehealth: Payer: Self-pay | Admitting: *Deleted

## 2015-09-13 NOTE — Telephone Encounter (Signed)
  Follow up Call-  Call back number 09/12/2015  Post procedure Call Back phone  # [phone]  Permission to leave phone message Yes     Patient questions:  Do you have a fever, pain , or abdominal swelling? No. Pain Score  0 *  Have you tolerated food without any problems? Yes.    Have you been able to return to your normal activities? Yes.    Do you have any questions about your discharge instructions: Diet   No. Medications  No. Follow up visit  No.  Do you have questions or concerns about your Care? No.  Actions: * If pain score is 4 or above: No action needed, pain <4.

## 2015-09-16 ENCOUNTER — Encounter: Payer: Self-pay | Admitting: Internal Medicine

## 2015-09-19 ENCOUNTER — Other Ambulatory Visit (HOSPITAL_COMMUNITY): Payer: Self-pay | Admitting: Urology

## 2015-09-19 DIAGNOSIS — R972 Elevated prostate specific antigen [PSA]: Secondary | ICD-10-CM

## 2015-10-11 ENCOUNTER — Other Ambulatory Visit: Payer: Self-pay | Admitting: Family Medicine

## 2015-10-26 ENCOUNTER — Ambulatory Visit (HOSPITAL_COMMUNITY)
Admission: RE | Admit: 2015-10-26 | Discharge: 2015-10-26 | Disposition: A | Discharge status: Home / Self Care | Payer: Federal, State, Local not specified - PPO | Source: Ambulatory Visit | Attending: Urology | Admitting: Urology

## 2015-10-26 ENCOUNTER — Other Ambulatory Visit (HOSPITAL_COMMUNITY): Payer: Self-pay | Admitting: Urology

## 2015-10-26 DIAGNOSIS — F40240 Claustrophobia: Secondary | ICD-10-CM | POA: Diagnosis not present

## 2015-10-26 DIAGNOSIS — R972 Elevated prostate specific antigen [PSA]: Secondary | ICD-10-CM | POA: Insufficient documentation

## 2015-10-26 DIAGNOSIS — N429 Disorder of prostate, unspecified: Secondary | ICD-10-CM | POA: Diagnosis not present

## 2015-11-03 ENCOUNTER — Ambulatory Visit: Payer: Medicare Other | Admitting: Family Medicine

## 2015-11-22 ENCOUNTER — Other Ambulatory Visit: Payer: Self-pay | Admitting: Family Medicine

## 2015-12-21 ENCOUNTER — Other Ambulatory Visit: Payer: Self-pay | Admitting: Family Medicine

## 2016-02-20 ENCOUNTER — Ambulatory Visit (INDEPENDENT_AMBULATORY_CARE_PROVIDER_SITE_OTHER): Payer: Federal, State, Local not specified - PPO | Admitting: Family Medicine

## 2016-02-20 ENCOUNTER — Encounter: Payer: Self-pay | Admitting: Family Medicine

## 2016-02-20 VITALS — BP 155/77 | HR 67 | Wt 264.0 lb

## 2016-02-20 DIAGNOSIS — Z0000 Encounter for general adult medical examination without abnormal findings: Secondary | ICD-10-CM

## 2016-02-20 MED ORDER — CEPHALEXIN 500 MG PO CAPS: 500.0000 mg | ORAL_CAPSULE | Freq: Four times a day (QID) | ORAL | Status: AC

## 2016-02-20 MED ORDER — NIACIN ER 500 MG PO CPCR: 500.0000 mg | ORAL_CAPSULE | Freq: Every day | ORAL | Status: AC

## 2016-02-20 MED ORDER — OMEGA-3-ACID ETHYL ESTERS 1 G PO CAPS: 2.0000 g | ORAL_CAPSULE | Freq: Two times a day (BID) | ORAL | Status: AC

## 2016-02-20 NOTE — Progress Notes (Signed)
CC: Anthony Santana is a 70 y.o. male is here for Annual Exam   Subjective: HPI:  Colonoscopy: Repeat 2019 Prostate: Discussed screening risks/beneifts with patient today, PSA UTD  Influenza Vaccine: UTD Pneumovax: UTD Td/Tdap: UTD Zoster: UTD  Requesting complete physical exam with no complaints other than resting a refill on Keflex for slowly healing wound on the side of the scrotum. Which was prescribed by his urologist   Review of Systems - General ROS: negative for - chills, fever, night sweats, weight gain or weight loss Ophthalmic ROS: negative for - decreased vision Psychological ROS: negative for - anxiety or depression ENT ROS: negative for - hearing change, nasal congestion, tinnitus or allergies Hematological and Lymphatic ROS: negative for - bleeding problems, bruising or swollen lymph nodes Breast ROS: negative Respiratory ROS: no cough, shortness of breath, or wheezing Cardiovascular ROS: no chest pain or dyspnea on exertion Gastrointestinal ROS: no abdominal pain, change in bowel habits, or black or bloody stools Genito-Urinary ROS: negative for - genital discharge, genital ulcers, incontinence or abnormal bleeding from genitals Musculoskeletal ROS: negative for - joint pain or muscle pain Neurological ROS: negative for - headaches or memory loss Dermatological ROS: negative for lumps, mole changes, rash and skin lesion changes  Past Medical History  Diagnosis Date  . Hyperlipidemia   . Prostatitis   . Kidney stones     passed stones  . Migraine     otc med prn  . Cataract     patient denies - "no cataracts"    Past Surgical History  Procedure Laterality Date  . Polyps removed from colon    . Prostate biopsy      x3; 1st time went into septic shock  . Tonsillectomy    . Colonoscopy  2013    polyps  . Polypectomy     Family History  Problem Relation Age of Onset  . Testicular cancer Father   . Alcohol abuse Father   . Cancer Maternal Grandmother    . Diabetes Maternal Grandmother   . Colon cancer Neg Hx   . Colon polyps Neg Hx   . Esophageal cancer Neg Hx   . Rectal cancer Neg Hx   . Stomach cancer Neg Hx     Social History   Social History  . Marital Status: Married    Spouse Name: N/A  . Number of Children: N/A  . Years of Education: N/A   Occupational History  . Not on file.   Social History Main Topics  . Smoking status: Former Smoker -- 1.00 packs/day    Types: Cigarettes    Quit date: 11/09/1979  . Smokeless tobacco: Never Used  . Alcohol Use: No  . Drug Use: No  . Sexual Activity: Not on file   Other Topics Concern  . Not on file   Social History Narrative     Objective: BP 155/77 mmHg  Pulse 67  Wt 264 lb (119.75 kg)  General: No Acute Distress HEENT: Atraumatic, normocephalic, conjunctivae normal without scleral icterus.  No nasal discharge, hearing grossly intact, TMs with good landmarks bilaterally with no middle ear abnormalities, posterior pharynx clear without oral lesions. Neck: Supple, trachea midline, no cervical nor supraclavicular adenopathy. Pulmonary: Clear to auscultation bilaterally without wheezing, rhonchi, nor rales. Cardiac: Regular rate and rhythm.  No murmurs, rubs, nor gallops. No peripheral edema.  2+ peripheral pulses bilaterally. Abdomen: Bowel sounds normal.  No masses.  Non-tender without rebound.  Negative Murphy's sign. MSK: Grossly intact, no signs of  weakness.  Full strength throughout upper and lower extremities.  Full ROM in upper and lower extremities.  No midline spinal tenderness. Neuro: Gait unremarkable, CN II-XII grossly intact.  C5-C6 Reflex 2/4 Bilaterally, L4 Reflex 2/4 Bilaterally.  Cerebellar function intact. Skin: No rashes. Psych: Alert and oriented to person/place/time.  Thought process normal. No anxiety/depression. Assessment & Plan: Anthony Santana was seen today for annual exam.  Diagnoses and all orders for this visit:  Annual physical exam -     Hepatitis  C antibody -     COMPLETE METABOLIC PANEL WITH GFR -     CBC -     Lipid panel -     cephALEXin (KEFLEX) 500 MG capsule; Take 1 capsule (500 mg total) by mouth 4 (four) times daily.  Other orders -     niacin 500 MG CR capsule; Take 1 capsule (500 mg total) by mouth at bedtime. -     omega-3 acid ethyl esters (LOVAZA) 1 g capsule; Take 2 capsules (2 g total) by mouth 2 (two) times daily.   Healthy lifestyle interventions including but not limited to regular exercise, a healthy low fat diet, moderation of salt intake, the dangers of tobacco/alcohol/recreational drug use, nutrition supplementation, and accident avoidance were discussed with the patient and a handout was provided for future reference.   No Follow-up on file.

## 2016-02-21 ENCOUNTER — Encounter: Payer: Self-pay | Admitting: Family Medicine

## 2016-02-22 ENCOUNTER — Telehealth: Payer: Self-pay | Admitting: Family Medicine

## 2016-02-22 LAB — CBC AND DIFFERENTIAL
HCT: 41 % (ref 41–53)
HEMOGLOBIN: 13.6 g/dL (ref 13.5–17.5)
Platelets: 217 10*3/uL (ref 150–399)
WBC: 5.5 10^3/mL

## 2016-02-22 LAB — LIPID PANEL
Cholesterol: 198 mg/dL (ref 0–200)
HDL: 37 mg/dL (ref 35–70)
LDL Cholesterol: 133 mg/dL
Triglycerides: 139 mg/dL (ref 40–160)

## 2016-02-22 LAB — BASIC METABOLIC PANEL
BUN: 14 mg/dL (ref 4–21)
Creatinine: 1 mg/dL (ref 0.6–1.3)
GLUCOSE: 102 mg/dL
POTASSIUM: 4.7 mmol/L (ref 3.4–5.3)
SODIUM: 143 mmol/L (ref 137–147)

## 2016-02-22 LAB — HEPATIC FUNCTION PANEL
ALK PHOS: 74 U/L (ref 25–125)
ALT: 16 U/L (ref 10–40)
AST: 18 U/L (ref 14–40)
Bilirubin, Total: 0.6 mg/dL

## 2016-02-22 NOTE — Telephone Encounter (Signed)
Left vm for pt to return call to clinic  

## 2016-02-22 NOTE — Telephone Encounter (Signed)
Pt.notified

## 2016-02-22 NOTE — Telephone Encounter (Signed)
Will you please let patient know that his LDL cholesterol is just barely elevated and significantly improved compared to 2016.  Also his blood sugar has improved compared to last year.  His liver function, kidney function, blood cell counts and his Hepatitis C test were normal.    (Also will you let him know that I've not received a fax from [facility] for his wife's Everlene Farrier) lab results.  If she already gave blood to [facility] can you please call [facility] and request lab results for her?)

## 2016-03-09 ENCOUNTER — Encounter: Payer: Self-pay | Admitting: Family Medicine

## 2016-05-13 IMAGING — MR MR PROSTATE W/O CM
4 series · 19 of 48 positions shown · IV contrast (agent unspecified)
Comparison: None.

CLINICAL DATA: Elevated PSA, negative biopsies in [place] per report.
Patient declined contrast administration. Study aborted following 4
sequences due to claustrophobia.

EXAM:
MR PROSTATE WITHOUT CONTRAST
TECHNIQUE: Multiplanar multisequence MRI images were obtained of the pelvis
centered about the prostate. Pre and post contrast images were
obtained.

[Series 3: (id) loc · axial · 8.0mm · 0.78mm/px · z∈[+88,+314]mm · 5 of 15 slices shown]
[im 1/15]
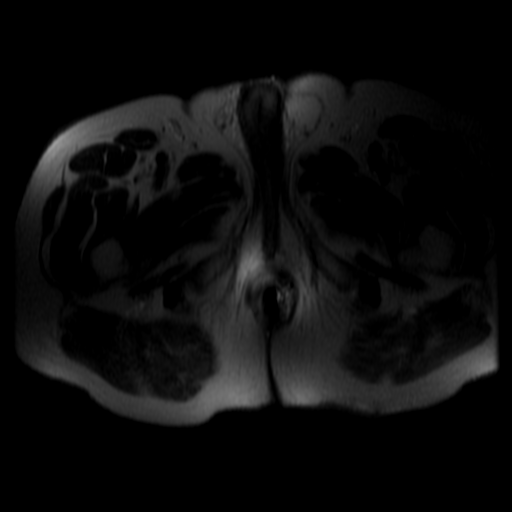
[im 4/15]
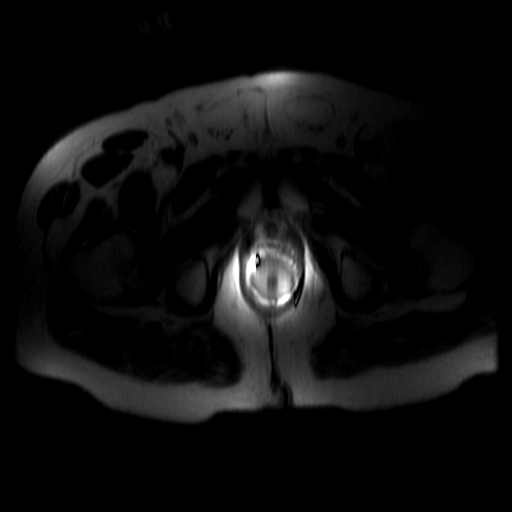
[im 8/15]
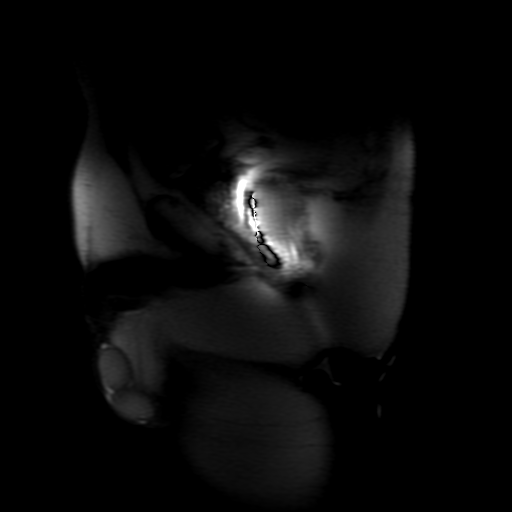
[im 11/15]
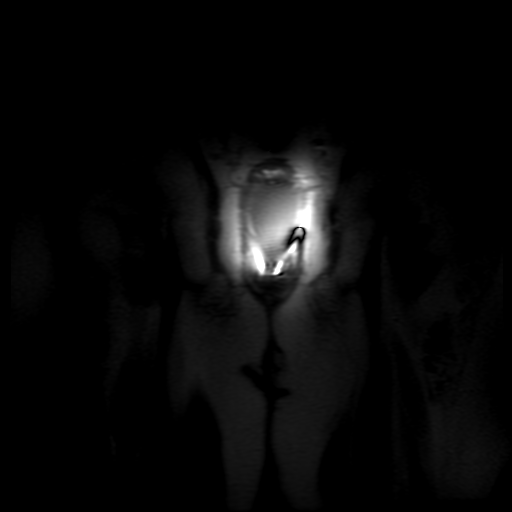
[im 15/15]
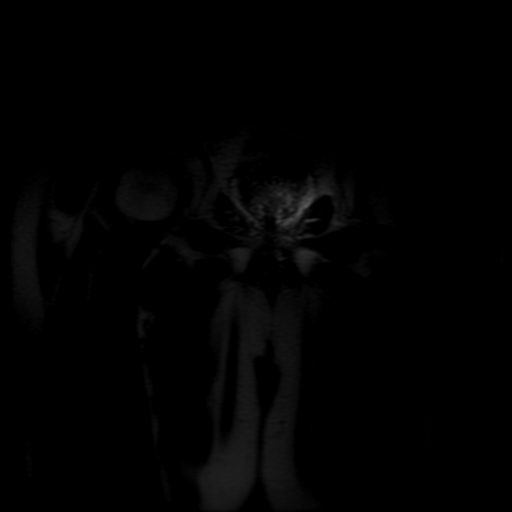

[Series 5: bSSFP fat-sat · axial · 6.0mm · 0.86mm/px · z∈[+25,+270]mm · 8 of 44 slices shown]
[im 3/44]
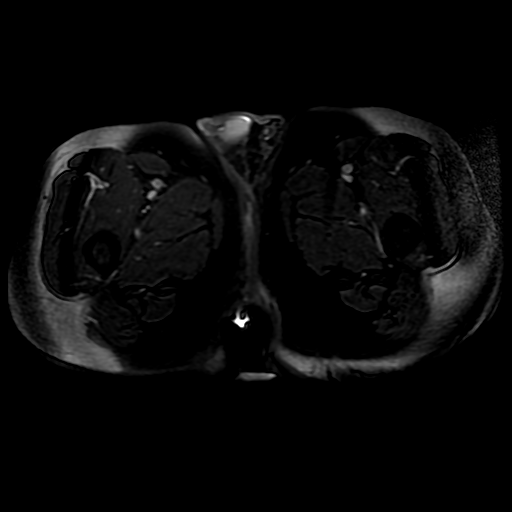
[im 6/44]
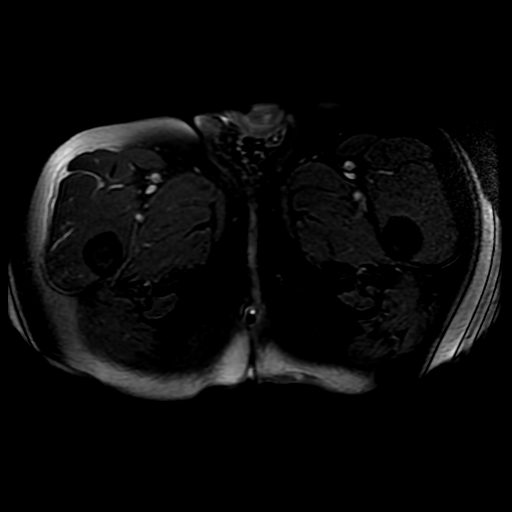
[im 9/44]
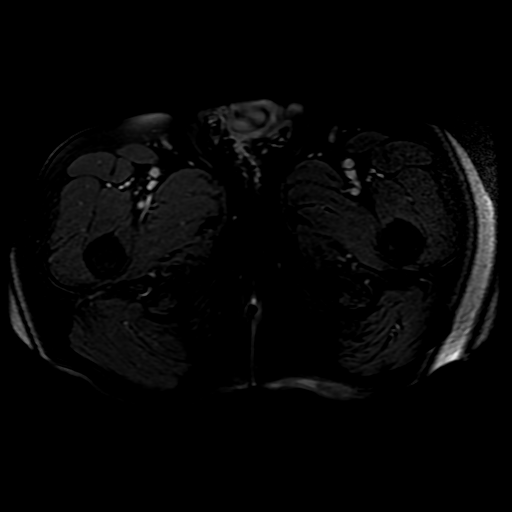
[im 15/44]
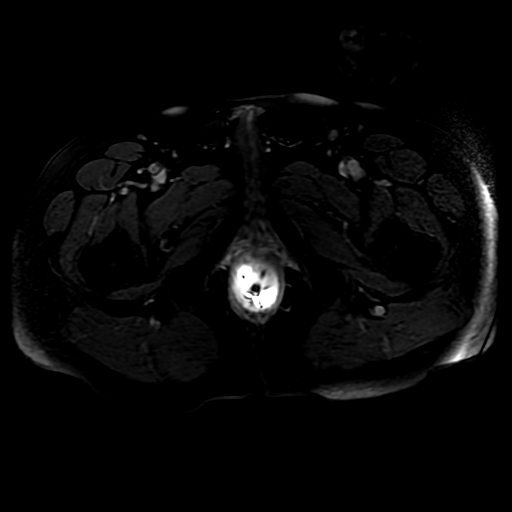
[im 21/44]
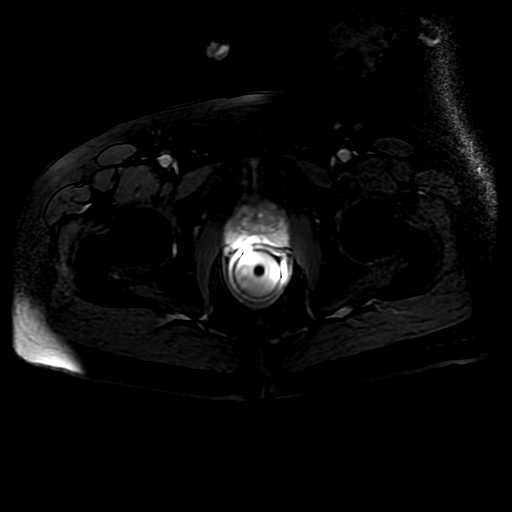
[im 23/44]
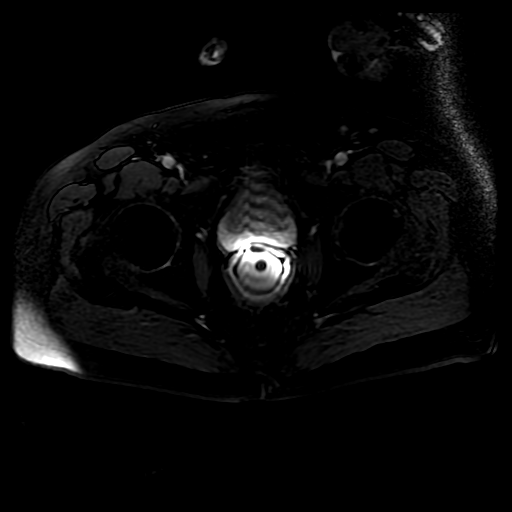
[im 26/44]
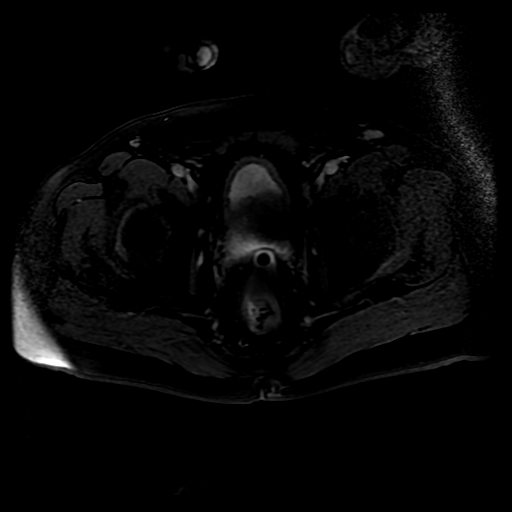
[im 38/44]
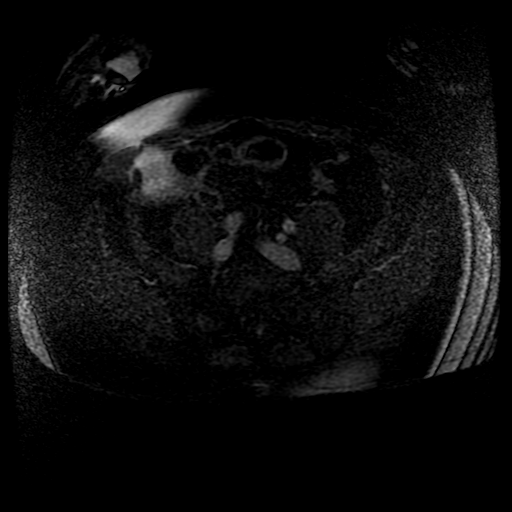

[Series 6: T1 · axial · 6.0mm · 0.86mm/px · z∈[+46,+270]mm · 3 of 44 slices shown]
[im 6/44]
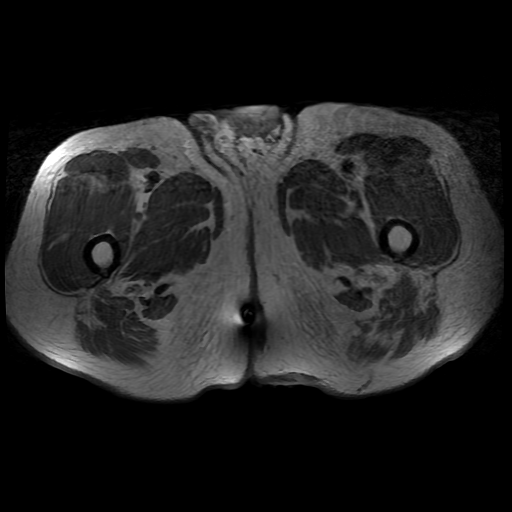
[im 23/44]
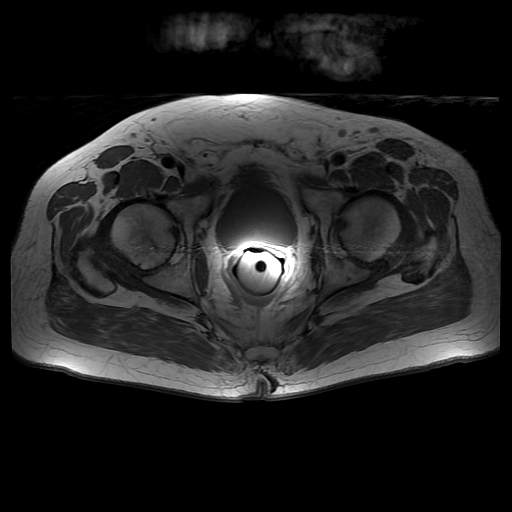
[im 38/44]
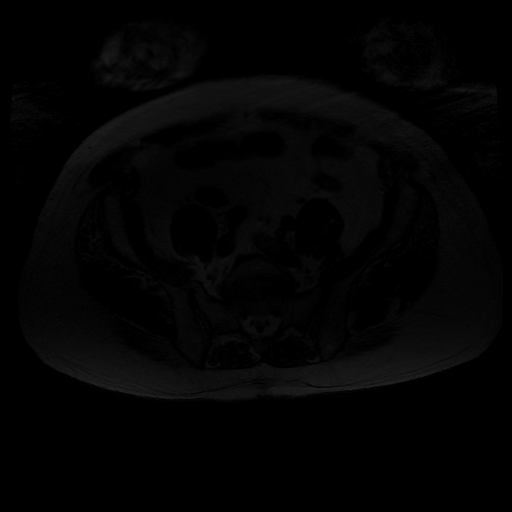

[Series 7: T2 · axial · 3.0mm · 0.29mm/px · z∈[+98,+180]mm · 3 of 32 slices shown]
[im 4/32]
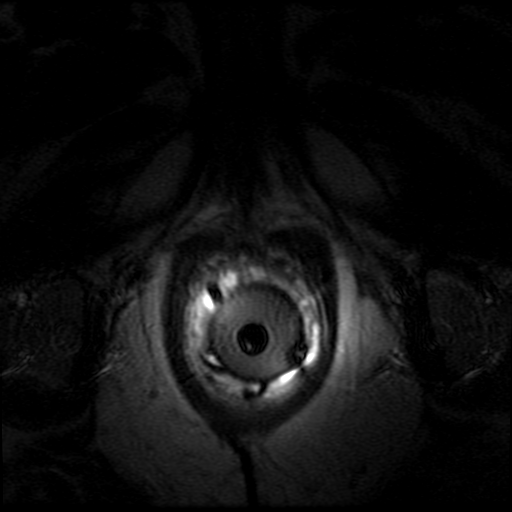
[im 16/32]
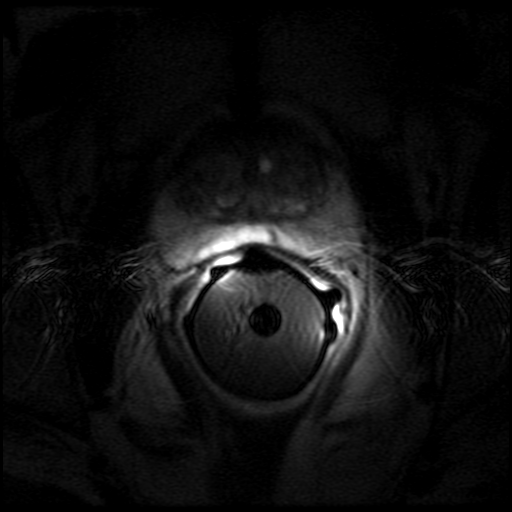
[im 28/32]
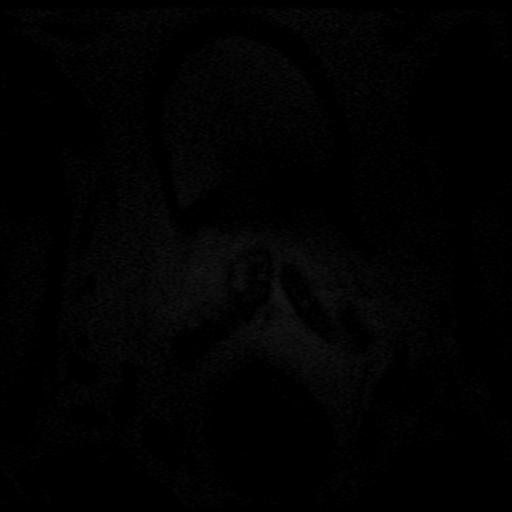

[19 of 48 positions shown; findings below may reference images not displayed]

FINDINGS: Limited evaluation due to lack of contrast administration and
premature termination of study secondary to claustrophobia.

Prostate: 6 mm focal low T2 lesion in the left apex (series 7/ image
23). Although diffusion and postcontrast imaging was not performed,
this is considered suspicious for macroscopic prostate cancer on T2.

Enlargement/ nodularity of the central gland, compatible with BPH.
No suspicious central gland nodularity on T2.

Transcapsular spread:  Absent.

Seminal vesicle involvement: Absent.

Neurovascular bundle involvement: Absent.

Pelvic adenopathy: Absent.

Bone metastasis: Absent.

Other findings: Bladder is mildly thick-walled although
underdistended.
IMPRESSION: Limited evaluation. Study was prematurely terminated secondary to
patient claustrophobia.

6 mm focal lesion in the left apex, suspicious for macroscopic
prostate cancer on T2. If repeat biopsy is performed, consider
targeting of this region.

No evidence of extracapsular extension, seminal vesicle invasion, or
metastatic disease.

## 2016-07-03 ENCOUNTER — Encounter: Payer: Self-pay | Admitting: Osteopathic Medicine

## 2016-07-03 DIAGNOSIS — H269 Unspecified cataract: Secondary | ICD-10-CM | POA: Insufficient documentation

## 2016-07-06 ENCOUNTER — Encounter: Payer: Self-pay | Admitting: Family Medicine

## 2016-07-06 LAB — HM DIABETES EYE EXAM

## 2018-10-07 ENCOUNTER — Encounter: Payer: Self-pay | Admitting: *Deleted
